# Patient Record
Sex: Female | Born: 2000 | Hispanic: No | Marital: Single | Smoking: Never smoker
Health system: Southern US, Community
[De-identification: ages and names within clinical notes are randomized; demographics above are authoritative.]

## PROBLEM LIST (undated history)

## (undated) DIAGNOSIS — K219 Gastro-esophageal reflux disease without esophagitis: Secondary | ICD-10-CM

## (undated) HISTORY — DX: Gastro-esophageal reflux disease without esophagitis: K21.9

## (undated) HISTORY — PX: APPENDECTOMY: SHX54

---
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
## 2022-05-22 IMAGING — DX DG ANKLE COMPLETE 3+V*L*
3 series · 3 of 3 positions shown · non-contrast
Comparison: None.

CLINICAL DATA: Pain, swelling, bruising. Twisted ankle two days
ago.

EXAM:
LEFT ANKLE COMPLETE - 3+ VIEW

[ankle ap]
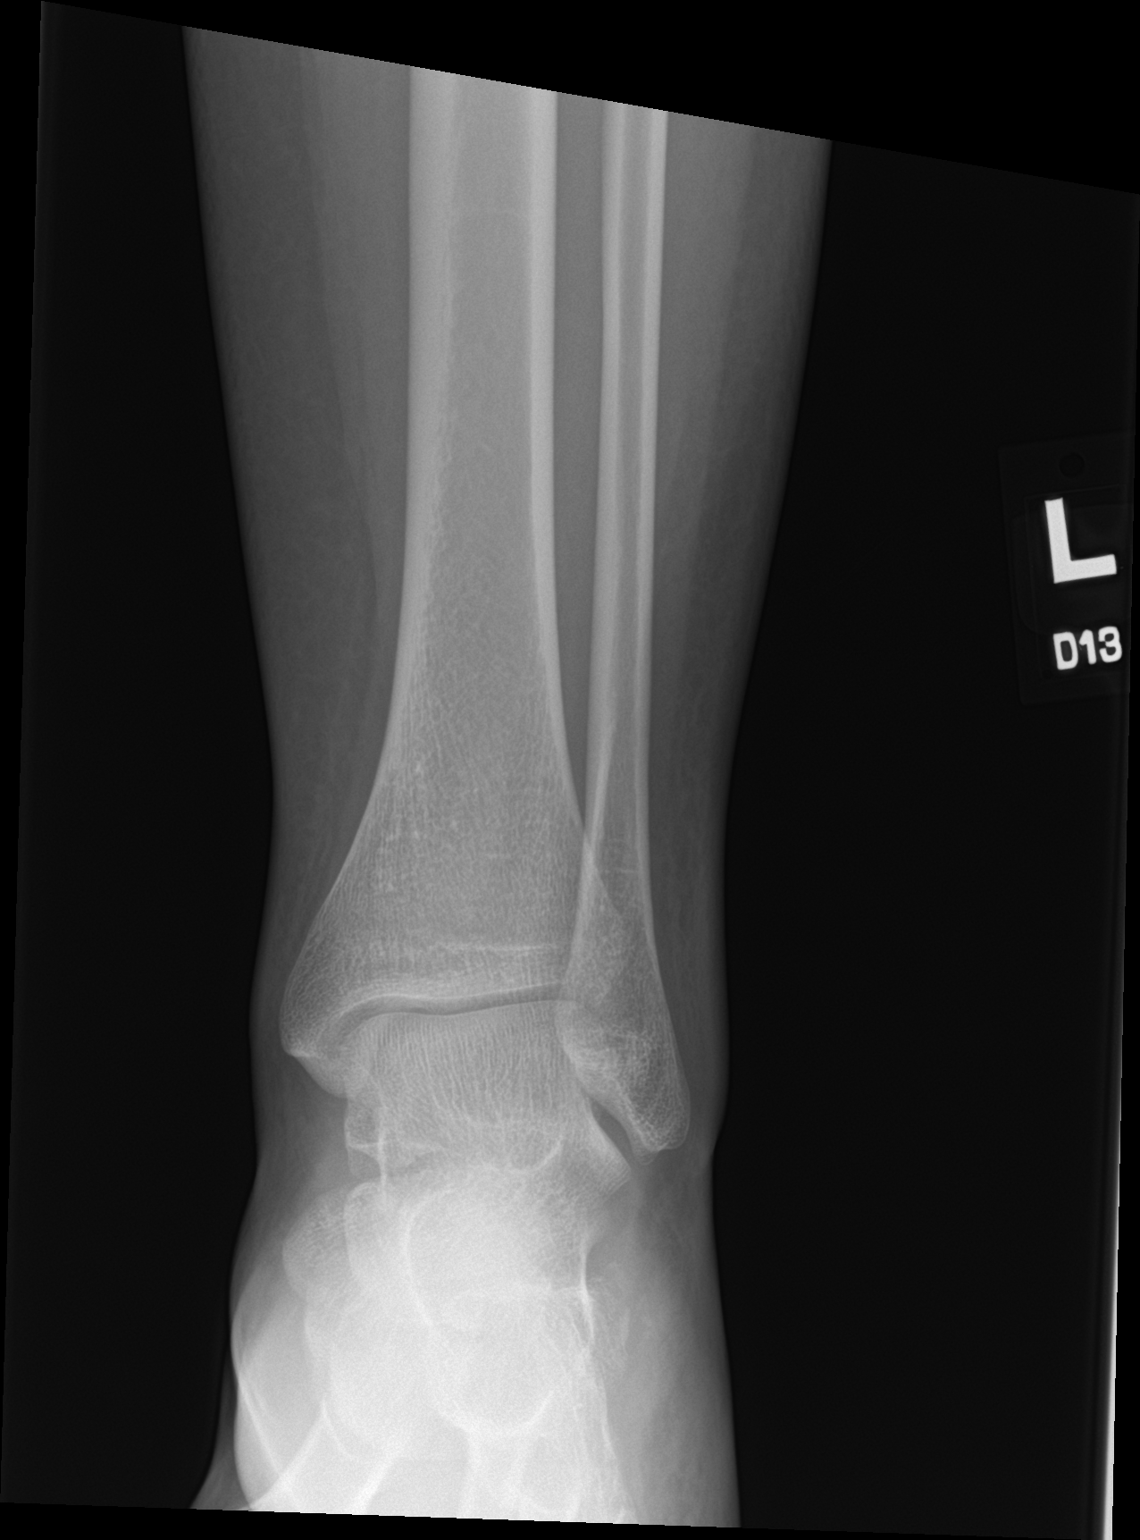

[ankle obl]
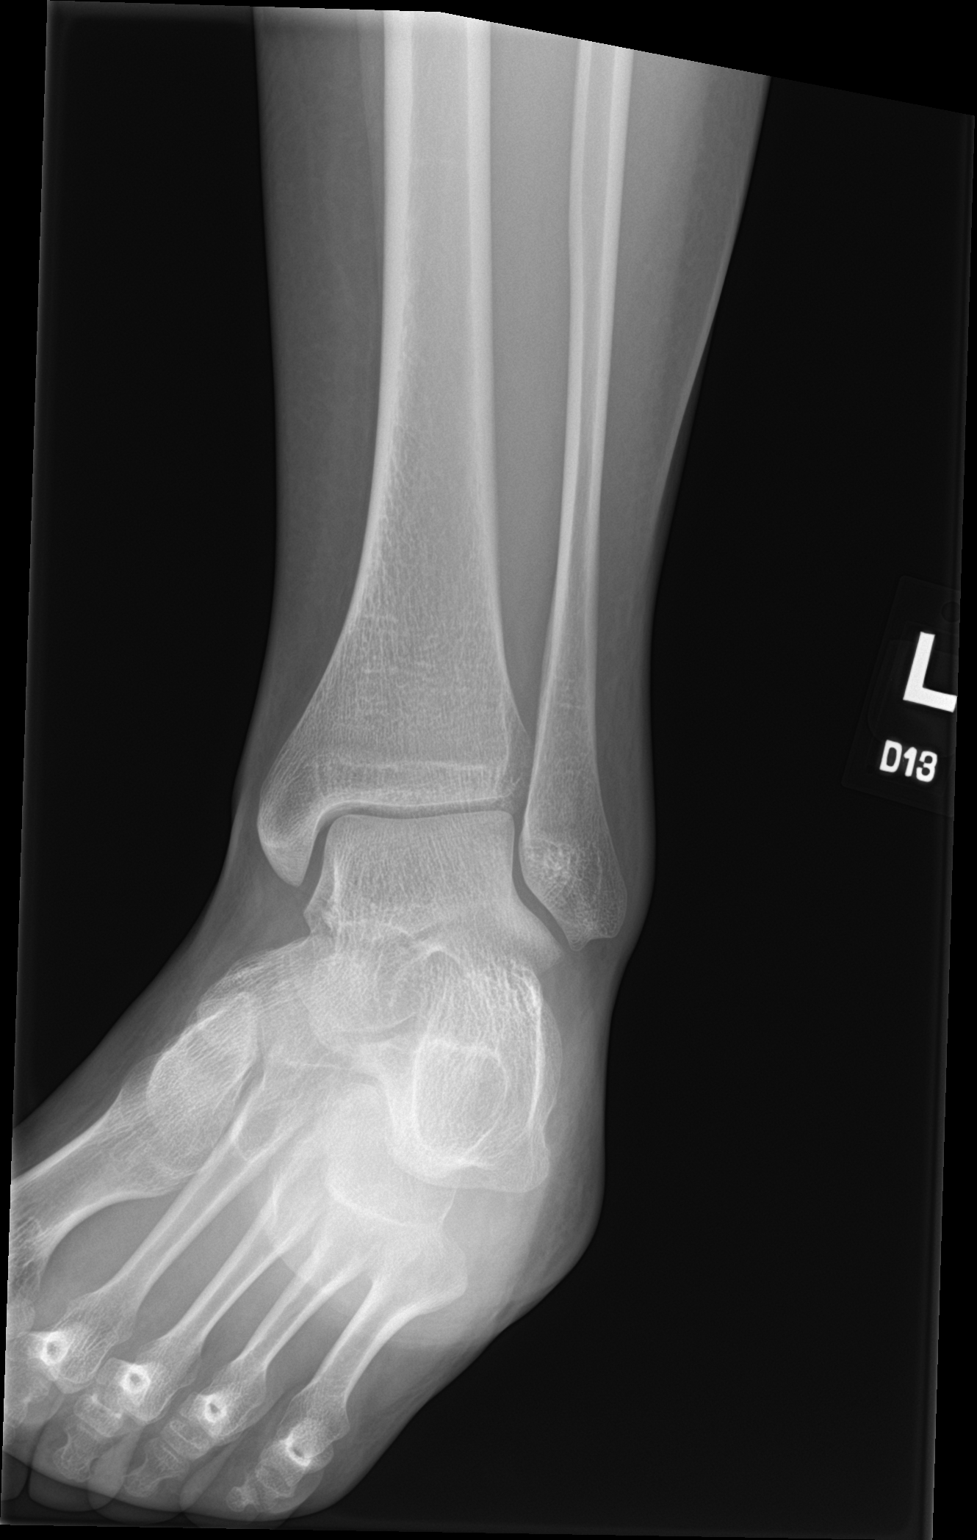

[ankle lat]
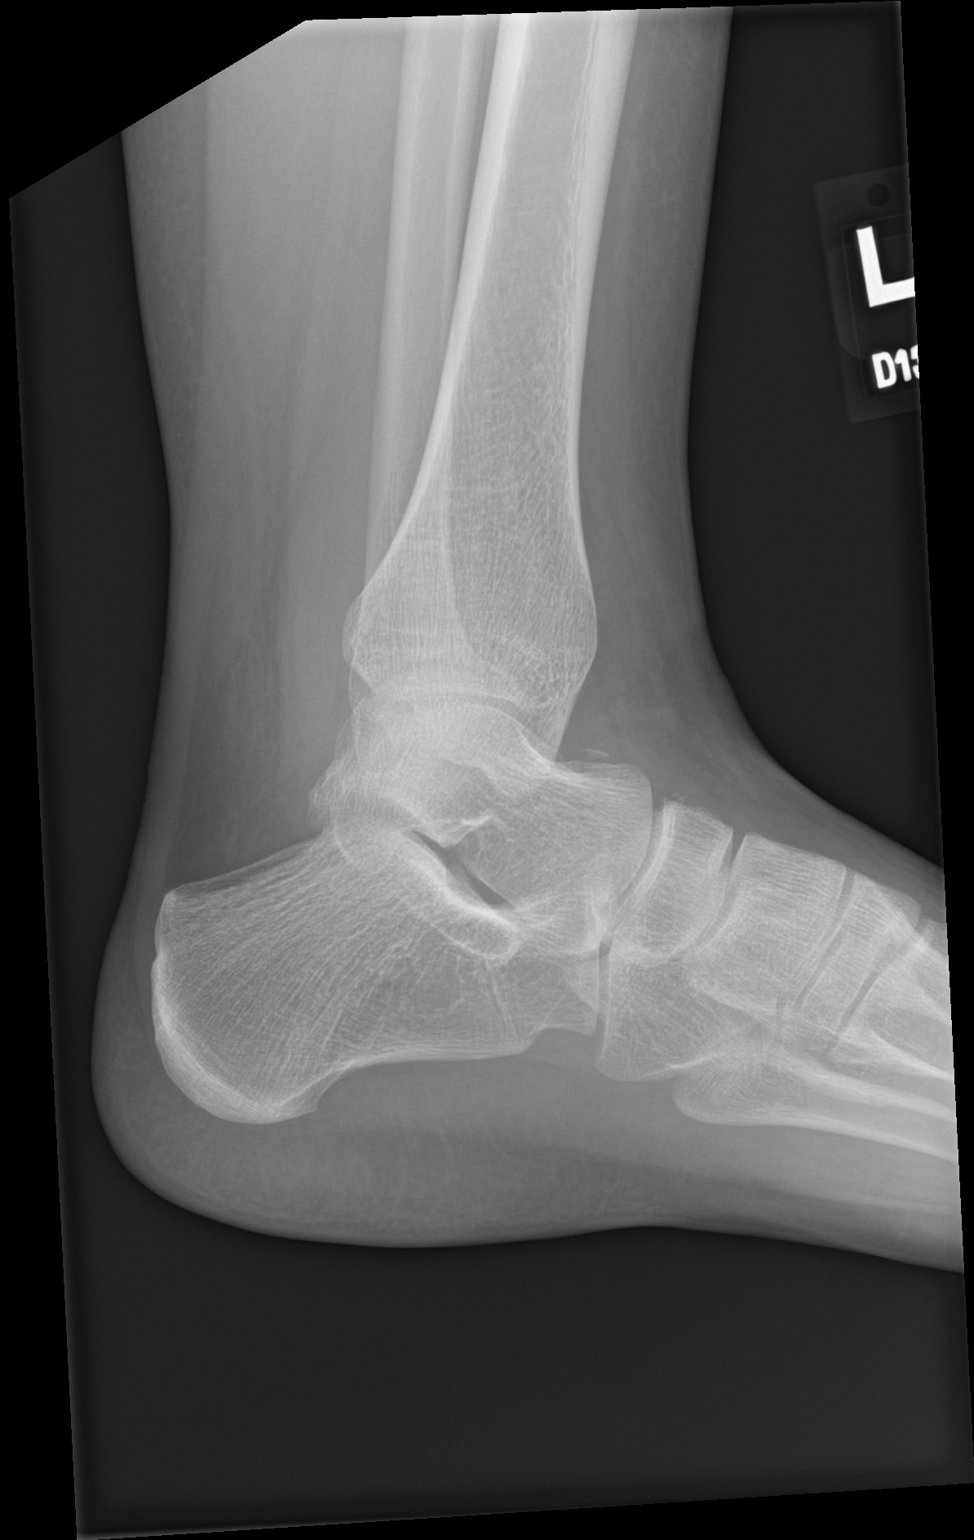

[3 of 3 positions shown; findings below may reference images not displayed]

FINDINGS: Small bone fragments noted off the anterior aspect of the distal
talus and navicular concerning for avulsed fragments. No additional
fracture. No subluxation or dislocation. Soft tissues are intact.
IMPRESSION: Small avulsed fragments off the anterior talus and navicular.

## 2023-04-09 ENCOUNTER — Encounter: Payer: Self-pay | Admitting: *Deleted
